# Patient Record
Sex: Male | Born: 1985 | Hispanic: No | Marital: Married | Smoking: Current every day smoker
Health system: Southern US, Community
[De-identification: ages and names within clinical notes are randomized; demographics above are authoritative.]

## PROBLEM LIST (undated history)

## (undated) DIAGNOSIS — M549 Dorsalgia, unspecified: Secondary | ICD-10-CM

---

## 2012-08-12 ENCOUNTER — Encounter (HOSPITAL_COMMUNITY): Payer: Self-pay | Admitting: Emergency Medicine

## 2012-08-12 ENCOUNTER — Emergency Department (HOSPITAL_COMMUNITY)
Admission: EM | Admit: 2012-08-12 | Discharge: 2012-08-13 | Disposition: A | Discharge status: Home / Self Care | Payer: Medicaid Other | Attending: Emergency Medicine | Admitting: Emergency Medicine

## 2012-08-12 DIAGNOSIS — F17200 Nicotine dependence, unspecified, uncomplicated: Secondary | ICD-10-CM | POA: Insufficient documentation

## 2012-08-12 DIAGNOSIS — M545 Low back pain, unspecified: Secondary | ICD-10-CM | POA: Insufficient documentation

## 2012-08-12 DIAGNOSIS — M549 Dorsalgia, unspecified: Secondary | ICD-10-CM

## 2012-08-12 MED ORDER — HYDROCODONE-ACETAMINOPHEN 5-325 MG PO TABS: 2.0000 | ORAL_TABLET | Freq: Four times a day (QID) | ORAL | Status: DC | PRN

## 2012-08-12 MED ORDER — DIAZEPAM 5 MG PO TABS: 5.0000 mg | ORAL_TABLET | Freq: Two times a day (BID) | ORAL | Status: DC

## 2012-08-12 MED ORDER — DIAZEPAM 5 MG PO TABS
5.0000 mg | ORAL_TABLET | Freq: Once | ORAL | Status: AC
  Administered 2012-08-12: 5 mg via ORAL
  Filled 2012-08-12: qty 1

## 2012-08-12 MED ORDER — HYDROCODONE-ACETAMINOPHEN 5-325 MG PO TABS
2.0000 | ORAL_TABLET | Freq: Once | ORAL | Status: AC
  Administered 2012-08-12: 2 via ORAL
  Filled 2012-08-12: qty 2

## 2012-08-12 NOTE — ED Notes (Deleted)
Patient states that he has had pain to his throat and a cough x 2 -3 days. The patient also reports dizziness

## 2012-08-12 NOTE — ED Notes (Signed)
Patient reports that he has had pain to his back since thurs. The patient denies any numbness or tingling to his legs

## 2012-08-12 NOTE — ED Provider Notes (Signed)
History     CSN: [id number]  Arrival date & time 08/12/12  2250   First MD Initiated Contact with Patient 08/12/12 2303      Chief Complaint  Patient presents with  . Back Pain    (Consider location/radiation/quality/duration/timing/severity/associated sxs/prior treatment) HPI Comments: Patient presents with a chief complaint of back pain.  Pain located on the left side of the lower back.  Pain worse with movement.  He reports that he has been feeling his back "spasm".   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    Patient is a 26 y.o. male presenting with back pain. The history is provided by the patient.  Back Pain  This is a new problem. Episode onset: 3 days ago. The problem occurs constantly. The problem has been gradually worsening. The pain does not radiate. The symptoms are aggravated by bending and twisting. Pertinent negatives include no fever, no numbness, no abdominal pain, no bowel incontinence, no perianal numbness, no bladder incontinence, no dysuria, no paresthesias, no paresis, no tingling and no weakness. He has tried NSAIDs and heat for the symptoms. The treatment provided mild relief.    History reviewed. No pertinent past medical history.  History reviewed. No pertinent past surgical history.  History reviewed. No pertinent family history.  History  Substance Use Topics  . Smoking status: Current Every Day Smoker    Types: Cigarettes  . Smokeless tobacco: Not on file  . Alcohol Use: No      Review of Systems  Constitutional: Negative for fever and chills.  Gastrointestinal: Negative for nausea, vomiting, abdominal pain and bowel incontinence.  Genitourinary: Negative for bladder incontinence, dysuria and decreased urine volume.       No bowel or bladder incontinence No urinary retention  Musculoskeletal: Positive for back pain.  Skin: Negative for color change and rash.  Neurological: Negative for tingling, weakness,  numbness and paresthesias.    Allergies  Review of patient's allergies indicates no known allergies.  Home Medications   Current Outpatient Rx  Name  Route  Sig  Dispense  Refill  . IBUPROFEN 200 MG PO TABS   Oral   Take 400 mg by mouth every 8 (eight) hours as needed. Back pain           BP 108/79  Pulse 93  Temp 98.7 F (37.1 C) (Oral)  Resp 18  Ht 6' (1.829 m)  Wt 250 lb (113.399 kg)  BMI 33.91 kg/m2  SpO2 96%  Physical Exam  Nursing note and vitals reviewed. Constitutional: He is oriented to person, place, and time. He appears well-developed and well-nourished. No distress.  HENT:  Head: Normocephalic and atraumatic.  Neck: Normal range of motion and full passive range of motion without pain. Neck supple. No spinous process tenderness and no muscular tenderness present. Normal range of motion present.  Cardiovascular: Normal rate, regular rhythm and intact distal pulses.  Exam reveals no gallop and no friction rub.   No murmur heard. Pulmonary/Chest: Effort normal and breath sounds normal. No respiratory distress. He has no wheezes. He has no rales. He exhibits no tenderness.  Musculoskeletal:       Cervical back: He exhibits normal range of motion, no tenderness, no bony tenderness and no pain.       Thoracic back: He exhibits no tenderness, no bony tenderness and no pain.       Lumbar back: He exhibits no tenderness, no bony tenderness, no spasm and normal pulse.  Bilateral lower extremities nontender without color change, baseline range of motion of extremities with intact distal pulses..  Pt has increased pain w ROM of lower back. Pain w ambulation, no sign of ataxia.  Neurological: He is alert and oriented to person, place, and time. He has normal strength and normal reflexes. No sensory deficit. Gait (no ataxia, slowed and hunched d/t pain ) abnormal.       Sensation at baseline for light touch in all 4 distal extremities, motor symmetric & bilateral 5/5  (hips: abduction, adduction, flexion; knee: flexion & extension; foot: dorsiflexion, plantar flexion, toes: dorsi flexion) Patellar & ankle reflexes intact.   Skin: Skin is warm and dry. No rash noted. He is not diaphoretic. No erythema. No pallor.  Psychiatric: He has a normal mood and affect.    ED Course  Procedures (including critical care time)  Labs Reviewed - No data to display No results found.   No diagnosis found.    MDM  Patient with back pain.  Pain consistent with muscle strain.  No neurological deficits and normal neuro exam.  Patient can walk but states is painful.  No loss of bowel or bladder control.  No concern for cauda equina.  No fever, night sweats, weight loss, h/o cancer, IVDU.  RICE protocol and pain medicine indicated and discussed with patient.  Patient also given prescription for muscle relaxer.          Pascal Lux Welty, PA-C 08/12/12 2359

## 2012-08-13 NOTE — ED Provider Notes (Signed)
Medical screening examination/treatment/procedure(s) were performed by non-physician practitioner and as supervising physician I was immediately available for consultation/collaboration.  Marwan T Powers, MD 08/13/12 0751 

## 2014-03-13 ENCOUNTER — Ambulatory Visit: Payer: Medicaid Other

## 2016-11-20 ENCOUNTER — Emergency Department (HOSPITAL_COMMUNITY)
Admission: EM | Admit: 2016-11-20 | Discharge: 2016-11-20 | Disposition: A | Discharge status: Home / Self Care | Payer: Medicaid Other | Attending: Emergency Medicine | Admitting: Emergency Medicine

## 2016-11-20 ENCOUNTER — Encounter (HOSPITAL_COMMUNITY): Payer: Self-pay | Admitting: Emergency Medicine

## 2016-11-20 ENCOUNTER — Emergency Department (HOSPITAL_COMMUNITY): Payer: Medicaid Other

## 2016-11-20 DIAGNOSIS — Y999 Unspecified external cause status: Secondary | ICD-10-CM | POA: Insufficient documentation

## 2016-11-20 DIAGNOSIS — R55 Syncope and collapse: Secondary | ICD-10-CM | POA: Insufficient documentation

## 2016-11-20 DIAGNOSIS — S0101XA Laceration without foreign body of scalp, initial encounter: Secondary | ICD-10-CM | POA: Insufficient documentation

## 2016-11-20 DIAGNOSIS — Y939 Activity, unspecified: Secondary | ICD-10-CM | POA: Insufficient documentation

## 2016-11-20 DIAGNOSIS — F17210 Nicotine dependence, cigarettes, uncomplicated: Secondary | ICD-10-CM | POA: Insufficient documentation

## 2016-11-20 DIAGNOSIS — Y929 Unspecified place or not applicable: Secondary | ICD-10-CM | POA: Insufficient documentation

## 2016-11-20 DIAGNOSIS — W228XXA Striking against or struck by other objects, initial encounter: Secondary | ICD-10-CM | POA: Insufficient documentation

## 2016-11-20 MED ORDER — BACITRACIN ZINC 500 UNIT/GM EX OINT
TOPICAL_OINTMENT | CUTANEOUS | Status: AC
Start: 2016-11-20 — End: 2016-11-20
  Administered 2016-11-20: 1
  Filled 2016-11-20: qty 3.6

## 2016-11-20 MED ORDER — LIDOCAINE-EPINEPHRINE (PF) 2 %-1:200000 IJ SOLN
20.0000 mL | Freq: Once | INTRAMUSCULAR | Status: AC
  Administered 2016-11-20: 20 mL
  Filled 2016-11-20: qty 20
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
## 2016-11-20 NOTE — ED Provider Notes (Signed)
[facility]-EMERGENCY DEPT Provider Note   CSN: [id number] Arrival date & time: 11/20/16  0125  By signing my name below, I, Diona BrownerJennifer Gorman, attest that this documentation has been prepared under the direction and in the presence of Gilda Creasehristopher J Jameika Kinn, MD. Electronically Signed: Diona BrownerJennifer Gorman, ED Scribe. 11/20/16. 3:49 AM.   History   Chief Complaint Chief Complaint  Patient presents with  . Laceration  . Head Injury    HPI Alfred Morton is a 31 y.o. male who presents to the Emergency Department complaining of a laceration with controlled bleeding to the occipital scalp s/p falling last night. Pt states he got up to use the bathroom because he suddenly experienced abdominal cramping when he became dizzy, lost consciousness and struck the back of his head. Associated sx include HA and left forearm pain. No worsening or alleviating factors noted. Pt denies elbow pain, back pain, wrist pain, neck pain, chest pain, and any other additional injuries.   The history is provided by the patient. No language interpreter was used.    No past medical history on file.  There are no active problems to display for this patient.   No past surgical history on file.     Home Medications    Prior to Admission medications   Not on File    Family History No family history on file.  Social History Social History  Substance Use Topics  . Smoking status: Current Every Day Smoker    Types: Cigarettes  . Smokeless tobacco: Never Used  . Alcohol use No     Allergies   Patient has no known allergies.   Review of Systems Review of Systems  Cardiovascular: Negative for chest pain.  Gastrointestinal: Positive for abdominal pain (resolved).  Musculoskeletal: Negative for arthralgias, back pain and neck pain.  Skin: Positive for wound (head).  Neurological: Positive for dizziness (resolved) and syncope.  All other systems reviewed and are negative.  Physical Exam Updated Vital Signs BP  116/84 (BP Location: Left Arm)   Pulse 90   Temp 97.8 F (36.6 C) (Oral)   Resp 18   Ht 6' (1.829 m)   Wt 241 lb 9.6 oz (109.6 kg)   SpO2 99%   BMI 32.77 kg/m   Physical Exam  Constitutional: He is oriented to person, place, and time. He appears well-developed and well-nourished. No distress.  HENT:  Head: Normocephalic.  Right Ear: Hearing normal.  Left Ear: Hearing normal.  Nose: Nose normal.  Mouth/Throat: Oropharynx is clear and moist and mucous membranes are normal.  3 cm linear laceration to the left occipital scalp   Eyes: Conjunctivae and EOM are normal. Pupils are equal, round, and reactive to light.  Neck: Normal range of motion. Neck supple.  Cardiovascular: Regular rhythm, S1 normal and S2 normal.  Exam reveals no gallop and no friction rub.   No murmur heard. Pulmonary/Chest: Effort normal and breath sounds normal. No respiratory distress. He exhibits no tenderness.  Abdominal: Soft. Normal appearance and bowel sounds are normal. There is no hepatosplenomegaly. There is no tenderness. There is no rebound, no guarding, no tenderness at McBurney's point and negative Murphy's sign. No hernia.  Musculoskeletal: Normal range of motion.  Neurological: He is alert and oriented to person, place, and time. He has normal strength. No cranial nerve deficit or sensory deficit. Coordination normal. GCS eye subscore is 4. GCS verbal subscore is 5. GCS motor subscore is 6.  Skin: Skin is warm, dry and intact. No rash noted. No  cyanosis.  Psychiatric: He has a normal mood and affect. His speech is normal and behavior is normal. Thought content normal.  Nursing note and vitals reviewed.    ED Treatments / Results  DIAGNOSTIC STUDIES: Oxygen Saturation is 99% on RA, normal by my interpretation.   COORDINATION OF CARE: 3:34 AM-Discussed next steps with pt which includes CT head, laceration repair. Pt verbalized understanding and is agreeable with the plan.    Labs (all labs  ordered are listed, but only abnormal results are displayed) Labs Reviewed - No data to display  EKG  EKG Interpretation None       Radiology Ct Head Wo Contrast  Result Date: 11/20/2016 CLINICAL DATA:  Pain after fall in bathtub. Positive loss of consciousness. 7 cm laceration to the posterior base of skull. EXAM: CT HEAD WITHOUT CONTRAST TECHNIQUE: Contiguous axial images were obtained from the base of the skull through the vertex without intravenous contrast. COMPARISON:  None. FINDINGS: Brain: No evidence of acute infarction, hemorrhage, hydrocephalus, extra-axial collection or mass lesion/mass effect. Vascular: No hyperdense vessel or unexpected calcification. Skull: Normal. Negative for fracture or focal lesion. Sinuses/Orbits: No acute finding. Other: No significant soft tissue swelling. IMPRESSION: No acute intracranial abnormality. Electronically Signed   By: Tollie Eth M.D.   On: 11/20/2016 04:26    Procedures .Marland KitchenLaceration Repair Date/Time: 11/20/2016 3:53 AM Performed by: Gilda Crease Authorized by: Gilda Crease   Consent:    Consent obtained:  Verbal   Consent given by:  Patient   Risks discussed:  Infection, pain, poor cosmetic result and poor wound healing   Alternatives discussed:  No treatment Universal protocol:    Procedure explained and questions answered to patient or proxy's satisfaction: yes     Imaging studies available: yes     Immediately prior to procedure, a time out was called: yes     Patient identity confirmed:  Verbally with patient Anesthesia (see MAR for exact dosages):    Anesthesia method:  Local infiltration   Local anesthetic:  Lidocaine 2% WITH epi Laceration details:    Location:  Scalp   Scalp location:  Occipital   Length (cm):  3 Repair type:    Repair type:  Simple Pre-procedure details:    Preparation:  Patient was prepped and draped in usual sterile fashion and imaging obtained to evaluate for foreign  bodies Exploration:    Hemostasis achieved with:  Epinephrine   Wound exploration: entire depth of wound probed and visualized   Treatment:    Area cleansed with:  Betadine and saline   Amount of cleaning:  Standard   Irrigation solution:  Sterile saline   Irrigation method:  Pressure wash Skin repair:    Repair method:  Staples   Number of staples:  7 Approximation:    Approximation:  Close Post-procedure details:    Dressing:  Antibiotic ointment and sterile dressing   Patient tolerance of procedure:  Tolerated well, no immediate complications     (including critical care time)  Medications Ordered in ED Medications  lidocaine-EPINEPHrine (XYLOCAINE W/EPI) 2 %-1:200000 (PF) injection 20 mL (not administered)     Initial Impression / Assessment and Plan / ED Course  I have reviewed the triage vital signs and the nursing notes.  Pertinent labs & imaging results that were available during my care of the patient were reviewed by me and considered in my medical decision making (see chart for details).     Patient presents to the emergency department for  evaluation of head injury. Patient reports that he had a syncopal episode at home. Patient reports that he started to have abdominal pain and cramping, felt like he was going to have a bowel movement, stood up to go to the bathroom, became dizzy and fell backwards. Patient suffered a laceration on the back of his head. Head CT is unremarkable. Patient is asymptomatic otherwise here in the ER. Suspect vasovagal response. Laceration repair performed with staples. Patient will require staple removal in 10 days.  Final Clinical Impressions(s) / ED Diagnoses   Final diagnoses:  Vasovagal syncope  Scalp laceration, initial encounter    New Prescriptions New Prescriptions   No medications on file    I personally performed the services described in this documentation, which was scribed in my presence. The recorded information has  been reviewed and is accurate.    Gilda Crease, MD 11/20/16 [phone]

## 2016-11-20 NOTE — ED Notes (Signed)
Pt states he felt dizziness and fell over and hit his head on the side of the tub. Pt lost consciousness.

## 2017-07-16 ENCOUNTER — Other Ambulatory Visit: Payer: Self-pay

## 2017-07-16 ENCOUNTER — Encounter (HOSPITAL_COMMUNITY): Payer: Self-pay | Admitting: Emergency Medicine

## 2017-07-16 ENCOUNTER — Emergency Department (HOSPITAL_COMMUNITY): Payer: No Typology Code available for payment source

## 2017-07-16 ENCOUNTER — Emergency Department (HOSPITAL_COMMUNITY)
Admission: EM | Admit: 2017-07-16 | Discharge: 2017-07-16 | Disposition: A | Discharge status: Home / Self Care | Payer: No Typology Code available for payment source | Attending: Emergency Medicine | Admitting: Emergency Medicine

## 2017-07-16 DIAGNOSIS — S161XXA Strain of muscle, fascia and tendon at neck level, initial encounter: Secondary | ICD-10-CM | POA: Diagnosis not present

## 2017-07-16 DIAGNOSIS — F17210 Nicotine dependence, cigarettes, uncomplicated: Secondary | ICD-10-CM | POA: Insufficient documentation

## 2017-07-16 DIAGNOSIS — Y92410 Unspecified street and highway as the place of occurrence of the external cause: Secondary | ICD-10-CM | POA: Insufficient documentation

## 2017-07-16 DIAGNOSIS — S39012A Strain of muscle, fascia and tendon of lower back, initial encounter: Secondary | ICD-10-CM | POA: Insufficient documentation

## 2017-07-16 DIAGNOSIS — Y9389 Activity, other specified: Secondary | ICD-10-CM | POA: Diagnosis not present

## 2017-07-16 DIAGNOSIS — Y998 Other external cause status: Secondary | ICD-10-CM | POA: Insufficient documentation

## 2017-07-16 DIAGNOSIS — S199XXA Unspecified injury of neck, initial encounter: Secondary | ICD-10-CM | POA: Diagnosis present

## 2017-07-16 HISTORY — DX: Dorsalgia, unspecified: M54.9

## 2017-07-16 MED ORDER — IBUPROFEN 400 MG PO TABS
600.0000 mg | ORAL_TABLET | Freq: Once | ORAL | Status: AC
  Administered 2017-07-16: 600 mg via ORAL
  Filled 2017-07-16: qty 1

## 2017-07-16 MED ORDER — CYCLOBENZAPRINE HCL 10 MG PO TABS: 10.0000 mg | ORAL_TABLET | Freq: Two times a day (BID) | ORAL | 0 refills | Status: AC | PRN

## 2017-07-16 MED ORDER — CYCLOBENZAPRINE HCL 10 MG PO TABS
10.0000 mg | ORAL_TABLET | Freq: Once | ORAL | Status: AC
  Administered 2017-07-16: 10 mg via ORAL
  Filled 2017-07-16: qty 1

## 2017-07-16 MED ORDER — DICLOFENAC SODIUM 50 MG PO TBEC: 50.0000 mg | DELAYED_RELEASE_TABLET | Freq: Two times a day (BID) | ORAL | 0 refills | Status: AC

## 2017-07-16 NOTE — ED Notes (Signed)
Patient taken to XRAY

## 2017-07-16 NOTE — ED Notes (Signed)
Patient Alert and oriented X4. Stable and ambulatory. Patient verbalized understanding of the discharge instructions.  Patient belongings were taken by the patient.  
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
## 2017-07-16 NOTE — Discharge Instructions (Signed)
The medication for muscle spasm can make you sleepy so do not drive when taking it.  If symptoms persist follow up with your doctor for further evaluation.  Return here as needed.
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
